# Patient Record
Sex: Male | Born: 1970 | Race: Black or African American | Hispanic: No
Health system: Southern US, Community
[De-identification: ages and names within clinical notes are randomized; demographics above are authoritative.]

---

## 2022-03-20 ENCOUNTER — Emergency Department (HOSPITAL_COMMUNITY): Payer: Self-pay

## 2022-03-20 ENCOUNTER — Encounter (HOSPITAL_COMMUNITY): Payer: Self-pay

## 2022-03-20 ENCOUNTER — Emergency Department (HOSPITAL_COMMUNITY)
Admission: EM | Admit: 2022-03-20 | Discharge: 2022-03-20 | Payer: Self-pay | Attending: Emergency Medicine | Admitting: Emergency Medicine

## 2022-03-20 DIAGNOSIS — R7989 Other specified abnormal findings of blood chemistry: Secondary | ICD-10-CM | POA: Insufficient documentation

## 2022-03-20 DIAGNOSIS — D72829 Elevated white blood cell count, unspecified: Secondary | ICD-10-CM | POA: Insufficient documentation

## 2022-03-20 DIAGNOSIS — R410 Disorientation, unspecified: Secondary | ICD-10-CM | POA: Insufficient documentation

## 2022-03-20 DIAGNOSIS — Y900 Blood alcohol level of less than 20 mg/100 ml: Secondary | ICD-10-CM | POA: Insufficient documentation

## 2022-03-20 LAB — COMPREHENSIVE METABOLIC PANEL
ALT: 28 U/L (ref 0–44)
AST: 53 U/L — ABNORMAL HIGH (ref 15–41)
Albumin: 4.2 g/dL (ref 3.5–5.0)
Alkaline Phosphatase: 56 U/L (ref 38–126)
Anion gap: 16 — ABNORMAL HIGH (ref 5–15)
BUN: 23 mg/dL — ABNORMAL HIGH (ref 6–20)
CO2: 19 mmol/L — ABNORMAL LOW (ref 22–32)
Calcium: 9 mg/dL (ref 8.9–10.3)
Chloride: 103 mmol/L (ref 98–111)
Creatinine, Ser: 1.89 mg/dL — ABNORMAL HIGH (ref 0.61–1.24)
GFR, Estimated: 43 mL/min — ABNORMAL LOW (ref >60)
Glucose, Bld: 98 mg/dL (ref 70–99)
Potassium: 3.2 mmol/L — ABNORMAL LOW (ref 3.5–5.1)
Sodium: 138 mmol/L (ref 135–145)
Total Bilirubin: 2.2 mg/dL — ABNORMAL HIGH (ref 0.3–1.2)
Total Protein: 7 g/dL (ref 6.5–8.1)

## 2022-03-20 LAB — CBC WITH DIFFERENTIAL/PLATELET
Abs Immature Granulocytes: 0.06 10*3/uL (ref 0.00–0.07)
Basophils Absolute: 0.1 10*3/uL (ref 0.0–0.1)
Basophils Relative: 0 %
Eosinophils Absolute: 0.2 10*3/uL (ref 0.0–0.5)
Eosinophils Relative: 1 %
HCT: 46.2 % (ref 39.0–52.0)
Hemoglobin: 15.4 g/dL (ref 13.0–17.0)
Immature Granulocytes: 1 %
Lymphocytes Relative: 22 %
Lymphs Abs: 2.7 10*3/uL (ref 0.7–4.0)
MCH: 29.6 pg (ref 26.0–34.0)
MCHC: 33.3 g/dL (ref 30.0–36.0)
MCV: 88.8 fL (ref 80.0–100.0)
Monocytes Absolute: 1 10*3/uL (ref 0.1–1.0)
Monocytes Relative: 8 %
Neutro Abs: 8.4 10*3/uL — ABNORMAL HIGH (ref 1.7–7.7)
Neutrophils Relative %: 68 %
Platelets: 413 10*3/uL — ABNORMAL HIGH (ref 150–400)
RBC: 5.2 MIL/uL (ref 4.22–5.81)
RDW: 13.1 % (ref 11.5–15.5)
WBC: 12.4 10*3/uL — ABNORMAL HIGH (ref 4.0–10.5)
nRBC: 0 % (ref 0.0–0.2)

## 2022-03-20 LAB — RAPID URINE DRUG SCREEN, HOSP PERFORMED
Amphetamines: NOT DETECTED
Barbiturates: NOT DETECTED
Benzodiazepines: NOT DETECTED
Cocaine: POSITIVE — AB
Opiates: NOT DETECTED
Tetrahydrocannabinol: NOT DETECTED

## 2022-03-20 LAB — ETHANOL: Alcohol, Ethyl (B): <10 mg/dL (ref <10)

## 2022-03-20 LAB — ACETAMINOPHEN LEVEL: Acetaminophen (Tylenol), Serum: <10 ug/mL — ABNORMAL LOW (ref 10–30)

## 2022-03-20 LAB — SALICYLATE LEVEL: Salicylate Lvl: <7 mg/dL — ABNORMAL LOW (ref 7.0–30.0)

## 2022-03-20 LAB — TROPONIN I (HIGH SENSITIVITY): Troponin I (High Sensitivity): 9 ng/L (ref <18)
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
## 2022-03-20 NOTE — Discharge Instructions (Addendum)
You had CT imaging and x-rays of your head, cervical spine, chest, pelvis.  No acute injuries seen on these images.  Blood work shows weak kidney function using a test called creatinine.  You will need to have this checked again by your doctor in the next couple of weeks.  If it continues to be weak, you may need additional tests.
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
## 2022-03-20 NOTE — ED Notes (Signed)
Lunch order placed

## 2022-03-20 NOTE — ED Notes (Signed)
Pt also reported that he took 6 benadryl

## 2022-03-20 NOTE — ED Notes (Signed)
Pt ambulated to bathroom to provide urine sample

## 2022-04-29 ENCOUNTER — Emergency Department (HOSPITAL_COMMUNITY)
Admission: EM | Admit: 2022-04-29 | Discharge: 2022-04-30 | Disposition: A | Discharge status: Home / Self Care | Payer: Self-pay | Attending: Emergency Medicine | Admitting: Emergency Medicine

## 2022-04-29 ENCOUNTER — Other Ambulatory Visit: Payer: Self-pay

## 2022-04-29 DIAGNOSIS — F1490 Cocaine use, unspecified, uncomplicated: Secondary | ICD-10-CM | POA: Insufficient documentation

## 2022-04-29 DIAGNOSIS — F200 Paranoid schizophrenia: Secondary | ICD-10-CM | POA: Insufficient documentation

## 2022-04-29 DIAGNOSIS — R45851 Suicidal ideations: Secondary | ICD-10-CM | POA: Insufficient documentation

## 2022-04-29 DIAGNOSIS — Y900 Blood alcohol level of less than 20 mg/100 ml: Secondary | ICD-10-CM | POA: Insufficient documentation

## 2022-04-29 LAB — COMPREHENSIVE METABOLIC PANEL
ALT: 19 U/L (ref 0–44)
AST: 28 U/L (ref 15–41)
Albumin: 4.3 g/dL (ref 3.5–5.0)
Alkaline Phosphatase: 53 U/L (ref 38–126)
Anion gap: 8 (ref 5–15)
BUN: 16 mg/dL (ref 6–20)
CO2: 24 mmol/L (ref 22–32)
Calcium: 9.1 mg/dL (ref 8.9–10.3)
Chloride: 106 mmol/L (ref 98–111)
Creatinine, Ser: 1.26 mg/dL — ABNORMAL HIGH (ref 0.61–1.24)
GFR, Estimated: >60 mL/min (ref >60)
Glucose, Bld: 111 mg/dL — ABNORMAL HIGH (ref 70–99)
Potassium: 3.2 mmol/L — ABNORMAL LOW (ref 3.5–5.1)
Sodium: 138 mmol/L (ref 135–145)
Total Bilirubin: 1.8 mg/dL — ABNORMAL HIGH (ref 0.3–1.2)
Total Protein: 7 g/dL (ref 6.5–8.1)

## 2022-04-29 LAB — SALICYLATE LEVEL: Salicylate Lvl: <7 mg/dL — ABNORMAL LOW (ref 7.0–30.0)

## 2022-04-29 LAB — CBC
HCT: 47.1 % (ref 39.0–52.0)
Hemoglobin: 15.7 g/dL (ref 13.0–17.0)
MCH: 28.9 pg (ref 26.0–34.0)
MCHC: 33.3 g/dL (ref 30.0–36.0)
MCV: 86.7 fL (ref 80.0–100.0)
Platelets: 400 10*3/uL (ref 150–400)
RBC: 5.43 MIL/uL (ref 4.22–5.81)
RDW: 13.5 % (ref 11.5–15.5)
WBC: 7.7 10*3/uL (ref 4.0–10.5)
nRBC: 0 % (ref 0.0–0.2)

## 2022-04-29 LAB — ETHANOL: Alcohol, Ethyl (B): <10 mg/dL (ref <10)

## 2022-04-29 LAB — ACETAMINOPHEN LEVEL: Acetaminophen (Tylenol), Serum: <10 ug/mL — ABNORMAL LOW (ref 10–30)

## 2022-04-29 MED ORDER — HALOPERIDOL 5 MG PO TABS
5.0000 mg | ORAL_TABLET | Freq: Once | ORAL | Status: AC
  Administered 2022-04-29: 5 mg via ORAL
  Filled 2022-04-29: qty 1

## 2022-04-29 MED ORDER — TRAZODONE HCL 50 MG PO TABS: 50.0000 mg | ORAL_TABLET | Freq: Every day | ORAL | Status: DC

## 2022-04-29 MED ORDER — LORAZEPAM 1 MG PO TABS
2.0000 mg | ORAL_TABLET | Freq: Once | ORAL | Status: AC
  Administered 2022-04-29: 2 mg via ORAL
  Filled 2022-04-29: qty 2
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
## 2022-04-29 NOTE — ED Notes (Signed)
The pt is awake quiet c0-0perative eating  cold food pt was given warm blankets and he is back to sleep now
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
## 2022-04-29 NOTE — Consult Note (Signed)
[facility] ED ASSESSMENT   Reason for Consult:  SI Referring Physician:  Dr. Lockie Mola Patient Identification: Roger Carter MRN:  [id number] ED Chief Complaint: Suicidal ideation  Diagnosis:  Principal Problem:   Suicidal ideation Active Problems:   Crack cocaine use   ED Assessment Time Calculation: Start Time: 1700 Stop Time: 1730 Total Time in Minutes (Assessment Completion): 30   Subjective:   Roger Carter is a 51 y.o. male patient brought in by police due to being involuntarily committed.  Patient admits to crack cocaine use for the last several days with no sleep.  Patient endorsed suicidal ideations to police denied a plan.  HPI:   Patient seen in his room at [facility], ED for face-to-face evaluation.  Patient has minimal history with [facility] and denies any past psychiatric diagnosis, however police stated per family he has a history of paranoid schizophrenia.  Patient received as needed medications for agitation earlier so he is difficult to engage as he feels very tired.  Patient would not sit up to talk to me, continued to lie on his stomach on the mattress with his eyes mostly closed during assessment.  Patient admits to crack cocaine use over the last several days and does state he has not been sleeping well.  He denies current suicidal or homicidal ideations.  I mention to him he was tearful to police telling them he was suicidal earlier and what could have changed so quickly.  Patient stated he does not remember saying he was suicidal and denies this.  Has any previous suicide attempts.  He denies any auditory visual hallucinations.  Denies any previous diagnoses or psychotropic medication trials.  Patient tells me he wants residential treatment.  I mention to him that he reportedly broke into someone's house and he stated that was his own house it was not a random persons place.  I asked patient questions regarding depression such as feelings of overall worthlessness,  anhedonia, lack of motivation, random tearfulness, and insomnia.  Patient denies all other than insomnia, and states that he does not struggle with depression.  Patient reports his only problem is drug use and is hoping to seek treatment.  As mentioned, patient was given PRN medication for agitation so he was difficult to engage and was very minimal in my assessment.  I asked if I could call his mother patient stated no.  He is able to engage in linear and coherent conversation.  He does not appear psychotic, does not seem to be responding to internal stimuli.  Speech is not pressured or rapid.  Patient agreeable to overnight observation and reevaluation tomorrow.  Trazodone 50 mg p.o. ordered to help assist with sleep.   Past Psychiatric History:  It is reported that a family member told [employer] that he has a history of paranoid schizophrenia.  However there is minimal history in his chart and patient denies any psychiatric history.  Risk to Self or Others: Is the patient at risk to self? Yes Has the patient been a risk to self in the past 6 months? No Has the patient been a risk to self within the distant past? No Is the patient a risk to others? No Has the patient been a risk to others in the past 6 months? No Has the patient been a risk to others within the distant past? No  Grenada Scale:  Flowsheet Row ED from 04/29/2022 in [facility] EMERGENCY DEPARTMENT ED from 03/20/2022 in [facility] EMERGENCY  DEPARTMENT  C-SSRS RISK CATEGORY High Risk No Risk         Past Medical History: No past medical history on file. No past surgical history on file. Family History: No family history on file.  Social History:  Social History   Substance and Sexual Activity  Alcohol Use Not on file     Social History   Substance and Sexual Activity  Drug Use Not on file    Social History   Socioeconomic History   Marital status: Unknown    Spouse name: Not on file    Number of children: Not on file   Years of education: Not on file   Highest education level: Not on file  Occupational History   Not on file  Tobacco Use   Smoking status: Not on file   Smokeless tobacco: Not on file  Substance and Sexual Activity   Alcohol use: Not on file   Drug use: Not on file   Sexual activity: Not on file  Other Topics Concern   Not on file  Social History Narrative   Not on file   Social Determinants of Health   Financial Resource Strain: Not on file  Food Insecurity: Not on file  Transportation Needs: Not on file  Physical Activity: Not on file  Stress: Not on file  Social Connections: Not on file   Additional Social History:    Allergies:  No Known Allergies  Labs:  Results for orders placed or performed during the hospital encounter of 04/29/22 (from the past 48 hour(s))  Comprehensive metabolic panel     Status: Abnormal   Collection Time: 04/29/22  9:41 AM  Result Value Ref Range   Sodium 138 135 - 145 mmol/L   Potassium 3.2 (L) 3.5 - 5.1 mmol/L   Chloride 106 98 - 111 mmol/L   CO2 24 22 - 32 mmol/L   Glucose, Bld 111 (H) 70 - 99 mg/dL    Comment: Glucose reference range applies only to samples taken after fasting for at least 8 hours.   BUN 16 6 - 20 mg/dL   Creatinine, Ser 2.29 (H) 0.61 - 1.24 mg/dL   Calcium 9.1 8.9 - 79.8 mg/dL   Total Protein 7.0 6.5 - 8.1 g/dL   Albumin 4.3 3.5 - 5.0 g/dL   AST 28 15 - 41 U/L   ALT 19 0 - 44 U/L   Alkaline Phosphatase 53 38 - 126 U/L   Total Bilirubin 1.8 (H) 0.3 - 1.2 mg/dL   GFR, Estimated >92 >11 mL/min    Comment: (NOTE) Calculated using the CKD-EPI Creatinine Equation (2021)    Anion gap 8 5 - 15    Comment: Performed at [facility], [address], [place], [place] [address]  Ethanol     Status: None   Collection Time: 04/29/22  9:41 AM  Result Value Ref Range   Alcohol, Ethyl (B) <10 <10 mg/dL    Comment: (NOTE) Lowest detectable limit for serum alcohol is 10  mg/dL.  For medical purposes only. Performed at [facility], [address], [place], [place] [address]   Salicylate level     Status: Abnormal   Collection Time: 04/29/22  9:41 AM  Result Value Ref Range   Salicylate Lvl <7.0 (L) 7.0 - 30.0 mg/dL    Comment: Performed at [facility], [address] [place], [place] [address]  Acetaminophen level     Status: Abnormal   Collection Time:  04/29/22  9:41 AM  Result Value Ref Range   Acetaminophen (Tylenol), Serum <10 (L) 10 - 30 ug/mL    Comment: (NOTE) Therapeutic concentrations vary significantly. A range of 10-30 ug/mL  may be an effective concentration for many patients. However, some  are best treated at concentrations outside of this range. Acetaminophen concentrations >150 ug/mL at 4 hours after ingestion  and >50 ug/mL at 12 hours after ingestion are often associated with  toxic reactions.  Performed at [facility], [address] [place], [place] [address]   cbc     Status: None   Collection Time: 04/29/22  9:41 AM  Result Value Ref Range   WBC 7.7 4.0 - 10.5 K/uL   RBC 5.43 4.22 - 5.81 MIL/uL   Hemoglobin 15.7 13.0 - 17.0 g/dL   HCT 93.7 16.9 - 67.8 %   MCV 86.7 80.0 - 100.0 fL   MCH 28.9 26.0 - 34.0 pg   MCHC 33.3 30.0 - 36.0 g/dL   RDW 93.8 10.1 - 75.1 %   Platelets 400 150 - 400 K/uL   nRBC 0.0 0.0 - 0.2 %    Comment: Performed at [facility], [address], [place], [place] [address]    Current Facility-Administered Medications  Medication Dose Route Frequency Provider Last Rate Last Admin   traZODone (DESYREL) tablet 50 mg  50 mg Oral QHS Eligha Bridegroom, NP       No current outpatient medications on file.    Psychiatric Specialty Exam: Presentation  General Appearance: Fairly Groomed  Eye Contact:Minimal  Speech:Clear and Coherent  Speech Volume:Decreased  Handedness:No data recorded  Mood and Affect  Mood:Euthymic  Affect:Congruent   Thought Process   Thought Processes:Coherent  Descriptions of Associations:Intact  Orientation:Full (Time, Place and Person)  Thought Content:Logical  History of Schizophrenia/Schizoaffective disorder:No data recorded Duration of Psychotic Symptoms:No data recorded Hallucinations:Hallucinations: None  Ideas of Reference:None  Suicidal Thoughts:Suicidal Thoughts: No  Homicidal Thoughts:Homicidal Thoughts: No   Sensorium  Memory:Immediate Fair  Judgment:Fair  Insight:Fair   Executive Functions  Concentration:Good  Attention Span:Good  Recall:Good  Fund of Knowledge:Good  Language:Good   Psychomotor Activity  Psychomotor Activity:Psychomotor Activity: Normal   Assets  Assets:Communication Skills; Desire for Improvement; Housing; Leisure Time; Physical Health; Resilience; Social Support    Sleep  Sleep:Sleep: Poor   Physical Exam: Physical Exam Neurological:     Mental Status: He is alert and oriented to person, place, and time.  Psychiatric:        Behavior: Behavior is cooperative.        Thought Content: Thought content normal.    Review of Systems  Psychiatric/Behavioral:  Positive for substance abuse. The patient has insomnia.   All other systems reviewed and are negative.  Blood pressure 100/67, pulse 77, resp. rate 16, SpO2 91 %. There is no height or weight on file to calculate BMI.  Medical Decision Making: Patient case reviewed and discussed with Dr. Sherron Flemings.  Will recommend overnight observation and reevaluation by psychiatry tomorrow.  Will likely try to transfer to [facility] behavioral health urgent care tomorrow.  Patient is agreeable to this plan.  Problem 1: Insomnia -Trazdone 50mg  po at bed time   Disposition: Patient does not meet criteria for psychiatric inpatient admission.  , NP 04/29/2022 5:20 PM

## 2022-04-29 NOTE — ED Notes (Signed)
Ivc paperwork in orange zone

## 2022-04-29 NOTE — ED Provider Notes (Signed)
[facility] EMERGENCY DEPARTMENT Provider Note   CSN: [id number] Arrival date & time: 04/29/22  3154     History  Chief Complaint  Patient presents with   Suicidal   Drug Problem    Roger Carter is a 51 y.o. male.  Patient here with IVC in place with police.  History of paranoid schizophrenia.  Suspect noncompliance per police.  Admits to crack cocaine use here the last several days with no sleep.  Patient was found after supposedly breaking into someone's house.  Patient endorsed suicidal ideation with police.  He is very tearful.  He states he suicidal.  He states he has not slept in 3 days.  Nothing makes it worse or better.  Denies any chest pain, shortness of breath, abdominal pain, neck pain, headache.  The history is provided by the patient and the police.       Home Medications Prior to Admission medications   Not on File      Allergies    Patient has no known allergies.    Review of Systems   Review of Systems  Physical Exam Updated Vital Signs BP 100/67   Pulse 77   Resp 16   SpO2 91%  Physical Exam Vitals and nursing note reviewed.  Constitutional:      General: He is not in acute distress.    Appearance: He is well-developed. He is not ill-appearing.  HENT:     Head: Normocephalic and atraumatic.     Nose: Nose normal.     Mouth/Throat:     Mouth: Mucous membranes are moist.  Eyes:     Extraocular Movements: Extraocular movements intact.     Conjunctiva/sclera: Conjunctivae normal.     Pupils: Pupils are equal, round, and reactive to light.  Cardiovascular:     Rate and Rhythm: Normal rate and regular rhythm.     Pulses: Normal pulses.     Heart sounds: Normal heart sounds. No murmur heard. Pulmonary:     Effort: Pulmonary effort is normal. No respiratory distress.     Breath sounds: Normal breath sounds.  Abdominal:     Palpations: Abdomen is soft.     Tenderness: There is no abdominal tenderness.  Musculoskeletal:         General: No swelling.     Cervical back: Neck supple.  Skin:    General: Skin is warm and dry.     Capillary Refill: Capillary refill takes less than 2 seconds.  Neurological:     Mental Status: He is alert.  Psychiatric:     Comments: Tearful, suicidal, no plan     ED Results / Procedures / Treatments   Labs (all labs ordered are listed, but only abnormal results are displayed) Labs Reviewed  COMPREHENSIVE METABOLIC PANEL - Abnormal; Notable for the following components:      Result Value   Potassium 3.2 (*)    Glucose, Bld 111 (*)    Creatinine, Ser 1.26 (*)    Total Bilirubin 1.8 (*)    All other components within normal limits  SALICYLATE LEVEL - Abnormal; Notable for the following components:   Salicylate Lvl <7.0 (*)    All other components within normal limits  ACETAMINOPHEN LEVEL - Abnormal; Notable for the following components:   Acetaminophen (Tylenol), Serum <10 (*)    All other components within normal limits  ETHANOL  CBC  RAPID URINE DRUG SCREEN, HOSP PERFORMED    EKG None  Radiology No results found.  Procedures Procedures    Medications Ordered in ED Medications  haloperidol (HALDOL) tablet 5 mg (5 mg Oral Given 04/29/22 0841)  LORazepam (ATIVAN) tablet 2 mg (2 mg Oral Given 04/29/22 0841)    ED Course/ Medical Decision Making/ A&P                           Medical Decision Making Amount and/or Complexity of Data Reviewed Labs: ordered.  Risk Prescription drug management.   Roger Carter is here with IVC in place.  Unremarkable vitals.  Patient with history of paranoid schizophrenia per police after talking with family member.  Patient was found in someone else's house.  Endorse suicidal ideation with police.  He admits to crack cocaine use here heavily the last several days.  Has not slept.  Has no chest pain or shortness of breath or physical symptoms.  He is requesting food and sedation.  He has been agitated with police but he  seems calm at this point.  He is very tearful.  Still endorses suicidal ideation but no plan.  We will do medical clearance labs of CBC CMP, UDS, ethanol level.  Will provide Haldol and Ativan to help keep patient calm.  We will have him examined by psychiatry once medically cleared.  IVC has been filed.  Medical clearance labs are unremarkable per my review and interpretation.  We will have him evaluated by psychiatry.  This chart was dictated using voice recognition software.  Despite best efforts to proofread,  errors can occur which can change the documentation meaning.         Final Clinical Impression(s) / ED Diagnoses Final diagnoses:  Suicidal ideation    Rx / DC Orders ED Discharge Orders     None         Virgina Norfolk, DO 04/29/22 1141

## 2022-04-29 NOTE — ED Notes (Signed)
Unable to complete remainder of triage process with patient. Pt continually argumentative and unwilling to cooperate with temperature, changing into scrubs, or blood work. Three [employer] officers remain at bedside.

## 2022-04-30 MED ORDER — NICOTINE 21 MG/24HR TD PT24
21.0000 mg | MEDICATED_PATCH | Freq: Every day | TRANSDERMAL | Status: DC
  Administered 2022-04-30: 21 mg via TRANSDERMAL
  Filled 2022-04-30: qty 1

## 2022-04-30 NOTE — Discharge Summary (Signed)
[facility] Psych ED Discharge  04/30/2022 11:40 AM Roger Carter  MRN:  [id number]  Principal Problem: Suicidal ideation Discharge Diagnoses: Principal Problem:   Suicidal ideation Active Problems:   Crack cocaine use  Clinical Impression:  Final diagnoses:  Suicidal ideation   Subjective:  Patient seen in his room in [facility], ED for face-to-face reevaluation.  Patient stated he slept very well last night and mentions wanting to discharge today.  Patient stated he does not remember much in the past few days or even speaking with me yesterday.  Patient continues to endorse having no psychiatric history.  Denies any symptoms of depression or anxiety.  Patient states the only substance he uses crack cocaine.  He states he does not use it every day but when he does use it he usually goes on a few day bender resulting in no sleep.  Patient stated the trigger for this bender was his wife kicked him out of the home.  Patient would not fully disclose to me why but stated it was because of substance abuse.  Patient is newly homeless and we spoke about shelter resources, [facility], residential treatment options, and crisis center such as [facility] behavioral health urgent care.  Patient stated he is originally from [place] and moved here with his wife in 2021, he is considering going back to [place].  Patient did give me permission to call his wife at [phone].  However the line was disconnected, he thinks she could have changed her number.  Ultimately he is wanting residential substance abuse treatment and his plan is to follow-up with that after discharge.  He denies any suicidal or homicidal ideations.  Denies any previous suicide attempts.  Denies any auditory or visual hallucinations.  He denies alcohol use.  Reports feeling much more clear today especially after last night's sleep.  He denies any history of psychotropic medications, states he does not need any medications.  Patient is alert and  oriented x4.  He is able to engage in linear and logical conversation.  He is able to contract for safety at this time and denies any suicidal or homicidal ideations.  Denies any hallucinations or paranoia.  He has a good discharge plan about following up with residential substance abuse treatment.  Patient is not psychotic, does not appear to be responding to any internal stimuli.  Patient feels as if his sleep has been restored.  Denies any current withdrawal symptoms from crack cocaine.  Discussed with Dr. Sherron Flemings, will psychiatrically clear the patient at this time.  ED Assessment Time Calculation: Start Time: 1000 Stop Time: 1030 Total Time in Minutes (Assessment Completion): 30   Past Psychiatric History:  Patient originally brought in by police due to being involuntarily committed.  Patient midst to crack cocaine use for the last several days for no sleep.  Endorsed suicidal ideations to police but denied plan.  Patient is originally from [place] we have limited history on the patient.  However he does deny previous psychiatric history.  Denies any previous inpatient psychiatric admissions.  Past Medical History: No past medical history on file. No past surgical history on file. Family History: No family history on file.  Social History:  Social History   Substance and Sexual Activity  Alcohol Use Not on file     Social History   Substance and Sexual Activity  Drug Use Not on file    Social History   Socioeconomic History   Marital status: Unknown  Spouse name: Not on file   Number of children: Not on file   Years of education: Not on file   Highest education level: Not on file  Occupational History   Not on file  Tobacco Use   Smoking status: Not on file   Smokeless tobacco: Not on file  Substance and Sexual Activity   Alcohol use: Not on file   Drug use: Not on file   Sexual activity: Not on file  Other Topics Concern   Not on file  Social History Narrative    Not on file   Social Determinants of Health   Financial Resource Strain: Not on file  Food Insecurity: Not on file  Transportation Needs: Not on file  Physical Activity: Not on file  Stress: Not on file  Social Connections: Not on file    Tobacco Cessation:  A prescription for an FDA-approved tobacco cessation medication provided at discharge  Current Medications: Current Facility-Administered Medications  Medication Dose Route Frequency Provider Last Rate Last Admin   nicotine (NICODERM CQ - dosed in mg/24 hours) patch 21 mg  21 mg Transdermal Daily Curatolo, Adam, DO       traZODone (DESYREL) tablet 50 mg  50 mg Oral QHS Eligha Bridegroom, NP       No current outpatient medications on file.   PTA Medications: (Not in a hospital admission)   G[place] Scale:  Flowsheet Row ED from 04/29/2022 in [facility] EMERGENCY DEPARTMENT ED from 03/20/2022 in [facility] EMERGENCY DEPARTMENT  C-SSRS RISK CATEGORY High Risk No Risk       Psychiatric Specialty Exam: Presentation  General Appearance: Appropriate for Environment  Eye Contact:Fair  Speech:Clear and Coherent  Speech Volume:Normal  Handedness:No data recorded  Mood and Affect  Mood:Euthymic  Affect:Congruent   Thought Process  Thought Processes:Coherent  Descriptions of Associations:Intact  Orientation:Full (Time, Place and Person)  Thought Content:Logical  History of Schizophrenia/Schizoaffective disorder:No data recorded Duration of Psychotic Symptoms:No data recorded Hallucinations:Hallucinations: None  Ideas of Reference:None  Suicidal Thoughts:Suicidal Thoughts: No  Homicidal Thoughts:Homicidal Thoughts: No   Sensorium  Memory:Immediate Good  Judgment:Fair  Insight:Fair   Executive Functions  Concentration:Good  Attention Span:Good  Recall:Good  Fund of Knowledge:Good  Language:Good   Psychomotor Activity  Psychomotor Activity:Psychomotor  Activity: Normal   Assets  Assets:Communication Skills; Desire for Improvement; Leisure Time; Physical Health; Resilience   Sleep  Sleep:Sleep: Good    Physical Exam: Physical Exam Neurological:     Mental Status: He is alert and oriented to person, place, and time.  Psychiatric:        Behavior: Behavior is cooperative.        Thought Content: Thought content normal.    Review of Systems  Psychiatric/Behavioral:  Positive for substance abuse.   All other systems reviewed and are negative.  Blood pressure 115/68, pulse 72, temperature 98.5 F (36.9 C), temperature source Axillary, resp. rate 18, SpO2 97 %. There is no height or weight on file to calculate BMI.   Demographic Factors:  Male and Low socioeconomic status  Loss Factors: Legal issues and Financial problems/change in socioeconomic status  Historical Factors: Substance abuse  Risk Reduction Factors:   Sense of responsibility to family, Religious beliefs about death, Positive social support, Positive therapeutic relationship, and Positive coping skills or problem solving skills  Continued Clinical Symptoms:  Alcohol/Substance Abuse/Dependencies  Cognitive Features That Contribute To Risk:  None    Suicide Risk:  Minimal: No identifiable suicidal ideation.  Patients presenting with no risk factors but with morbid ruminations; may be classified as minimal risk based on the severity of the depressive symptoms    Plan Of Care/Follow-up recommendations:  Other:  Please follow up with OP psychiatry and therapy. Ensure to follow up with residential substance abuse treatment facilities, resources for all of the above have been left in AVS.   Medical Decision Making: Patient case discussed and reviewed with Dr. Sherron Flemings.  At this time patient does not meet criteria for IVC or inpatient psychiatric hospitalization.  Patient is able to contract for safety and has a stable discharge plan of looking into substance  abuse residential treatment.  Patient is psychiatrically cleared at this time.  EDP, RN, LCSW notified of disposition.   Disposition: Psych cleared  Eligha Bridegroom, NP 04/30/2022, 11:40 AM

## 2022-04-30 NOTE — ED Notes (Signed)
PT IN BLUE PAPER SCRUBS

## 2022-04-30 NOTE — ED Notes (Addendum)
Patient offered trazodone, he refused. Patient denies any nausea/vomiting. Patient denies any Suicidal ideation. Patient stated he just wants to go home. Patient belongings placed in locker 14. Patient in his room with the door open and he is resting at this time. Call bell within reach and drinks offered.

## 2022-04-30 NOTE — ED Notes (Signed)
Report given to [facility] RN. All questions answered at this time. Pt ambulatory to move from room 22 to HW18.

## 2022-04-30 NOTE — ED Notes (Addendum)
Patient awake and alert at this time. Patient asking to take shower and to brush his teeth. Patient given Blue paper scrubs at this times as well as soap, towels, washcloths, lotion, and toothbrush, toothpaste. Patient asking for his money. Patient advised if he had money on him then It would still be in his pocket. Patient aware that [employer] may have taken it prior to bringing him to the ED. Patient asking for food at this time and he was provided a sandwich, peanut butter, crackers, and a drink. Patient respectful and cooperating at this time.

## 2022-04-30 NOTE — ED Provider Notes (Signed)
Patient cleared by psychiatry.  Discharge.   Virgina Norfolk, DO 04/30/22 1307

## 2022-04-30 NOTE — ED Notes (Signed)
Patient sleeping at this time. Patient not dressed in purple scrubs due to him sleeping since he has been here yesterday.

## 2022-04-30 NOTE — Discharge Instructions (Addendum)
      [facility]  Hours Monday - Friday: Services: 8:00AM - 3:00PM Offices: 8:00AM - 5:00PM  Physical Address [address] [place], [place] [address]   Please use this address for [facility] Mailing Address [address] [address] [place], [place] [address]  The [facility] helps people reconnect This is a safe place to rest, take care of basic needs and access the services and community that make all the difference. Our guests come to the [facility] to take a class, do laundry, meet with a case manager or to get their mail. Sometimes they just need to sit in our dayroom and enjoy a conversation.  Here you will find everything from shower facilities to a computer lab, a mail room, classrooms and meeting spaces.  The [facility] helps people reconnect with their own lives and with the community at large.  A caring community setting One of the most exciting aspects of the [facility] is that so many individuals and organizations in the community are a part of the everyday experience. Whether it's a hair stylist or law firm offering services right in-house, our partners make the [facility] a truly interactive resource center where services are brought to our guests. The [facility] brings together a comprehensive community of talented people who not only want to help solve problems, but also to be a part of our guests' lives.  Integrated Care We take a person-centered approach to assistance that includes: Case management [facility] Mental health nurse Referrals  Fundamental Services We start with necessities: Showers and hygiene supplies Laundry Phone access Mailing addresses and mailboxes Replacement IDs Onsite barbershop Storage lockers [place] winter warming center  Self-Sufficiency We connect our guests with: Skilled trade classes Job skills classes Resume and jobs application assistance Interview training GED classes Professional clothing vouchers Financial literacy 

## 2023-06-05 ENCOUNTER — Emergency Department (HOSPITAL_COMMUNITY)
Admission: EM | Admit: 2023-06-05 | Discharge: 2023-06-06 | Disposition: A | Discharge status: Home / Self Care | Payer: No Typology Code available for payment source | Attending: Emergency Medicine | Admitting: Emergency Medicine

## 2023-06-05 ENCOUNTER — Emergency Department (HOSPITAL_COMMUNITY): Payer: No Typology Code available for payment source

## 2023-06-05 ENCOUNTER — Encounter (HOSPITAL_COMMUNITY): Payer: Self-pay

## 2023-06-05 DIAGNOSIS — S0990XA Unspecified injury of head, initial encounter: Secondary | ICD-10-CM | POA: Diagnosis present

## 2023-06-05 DIAGNOSIS — Y92410 Unspecified street and highway as the place of occurrence of the external cause: Secondary | ICD-10-CM | POA: Insufficient documentation

## 2023-06-05 DIAGNOSIS — M542 Cervicalgia: Secondary | ICD-10-CM | POA: Diagnosis not present

## 2023-06-05 DIAGNOSIS — M546 Pain in thoracic spine: Secondary | ICD-10-CM | POA: Diagnosis not present

## 2023-06-05 DIAGNOSIS — M549 Dorsalgia, unspecified: Secondary | ICD-10-CM

## 2023-06-05 DIAGNOSIS — S060X0A Concussion without loss of consciousness, initial encounter: Secondary | ICD-10-CM | POA: Insufficient documentation
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
## 2023-06-06 MED ORDER — METHOCARBAMOL 500 MG PO TABS
1000.0000 mg | ORAL_TABLET | Freq: Once | ORAL | Status: AC
  Administered 2023-06-06: 1000 mg via ORAL
  Filled 2023-06-06: qty 2

## 2023-06-06 MED ORDER — PREDNISONE 20 MG PO TABS: 40.0000 mg | ORAL_TABLET | Freq: Every day | ORAL | 0 refills | Status: AC

## 2023-06-06 MED ORDER — ACETAMINOPHEN 500 MG PO TABS
1000.0000 mg | ORAL_TABLET | Freq: Once | ORAL | Status: AC
  Administered 2023-06-06: 1000 mg via ORAL
  Filled 2023-06-06: qty 2

## 2023-06-06 MED ORDER — CYCLOBENZAPRINE HCL 10 MG PO TABS: 10.0000 mg | ORAL_TABLET | Freq: Two times a day (BID) | ORAL | 0 refills | Status: AC | PRN

## 2023-06-06 NOTE — Discharge Instructions (Signed)
it is possible that you have a slight concussion, symptoms include a mild headache, brain fog, dizziness, lightheadedness, difficulty concentrating, increase sensitivity to light or noise.  The symptoms will resolve on their own I recommend brain rest i.e. decreasing screen time, vigorous activities and slowly reintroduce them as tolerated.  If your symptoms persist over the weeks time please follow-up with your PCP and/or the concussion clinic for further evaluation you may take over-the-counter pain medication as needed.  Back pain-likely muscular, given you a muscle relaxer as well as a steroid take as prescribed, may use over-the-counter pain medication as needed.  If symptoms not proved 2 to 3 weeks please follow-up with neurosurgery for further assessment.   I have also given you a prescription for a muscle relaxer this can make you drowsy do not consume alcohol or operate heavy machinery when taking this medication.     Come back to the emergency department if you develop chest pain, shortness of breath, severe abdominal pain, uncontrolled nausea, vomiting, diarrhea.
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
